# Patient Record
Sex: Male | Born: 1970 | Race: White | Hispanic: No | Marital: Married | Smoking: Never smoker
Health system: Southern US, Community
[De-identification: ages and names within clinical notes are randomized; demographics above are authoritative.]

## PROBLEM LIST (undated history)

## (undated) DIAGNOSIS — K449 Diaphragmatic hernia without obstruction or gangrene: Secondary | ICD-10-CM

## (undated) HISTORY — DX: Diaphragmatic hernia without obstruction or gangrene: K44.9

## (undated) HISTORY — PX: COLONOSCOPY: SHX174

## (undated) HISTORY — PX: UPPER GASTROINTESTINAL ENDOSCOPY: SHX188

---

## 2019-03-22 ENCOUNTER — Other Ambulatory Visit: Payer: Self-pay

## 2019-03-22 DIAGNOSIS — Z20822 Contact with and (suspected) exposure to covid-19: Secondary | ICD-10-CM

## 2019-03-23 LAB — NOVEL CORONAVIRUS, NAA: SARS-CoV-2, NAA: NOT DETECTED

## 2019-03-27 ENCOUNTER — Other Ambulatory Visit: Payer: Self-pay | Admitting: Internal Medicine

## 2019-03-27 DIAGNOSIS — E785 Hyperlipidemia, unspecified: Secondary | ICD-10-CM

## 2019-05-01 ENCOUNTER — Ambulatory Visit
Admission: RE | Admit: 2019-05-01 | Discharge: 2019-05-01 | Disposition: A | Discharge status: Home / Self Care | Payer: No Typology Code available for payment source | Source: Ambulatory Visit | Attending: Internal Medicine | Admitting: Internal Medicine

## 2019-05-01 DIAGNOSIS — E785 Hyperlipidemia, unspecified: Secondary | ICD-10-CM

## 2020-09-30 ENCOUNTER — Encounter: Payer: Self-pay | Admitting: Internal Medicine

## 2020-10-01 ENCOUNTER — Telehealth: Payer: Self-pay | Admitting: *Deleted

## 2020-10-01 NOTE — Telephone Encounter (Signed)
Pyrtle, Carie Caddy, MD  Richardson Chiquito, CMA  I do not see a problem adding the EGD  I will ask the office to do so.  The office will try to pre-certify the EGD portion and they should let me know if there is an issue.  I do not think formal office visit is needed (unless insurance won't pay without).  Thanks   Beverlyn Roux,  Please add EGD to the already scheduled colonoscopy for Dr. Mena Goes. Indication: GERD, epigastric pain  JMP        Dr. Clelia Croft referred over for routine screening colonoscopy.  + heart burn and epigastric pain which is non-cardiac (neg EKG,  go to gym/run a few times a week, negative coronary calcium scan) over the past 2-3 years. had an EGD in 2011 while in the [employer] and it showed a small fundal polyp and hiatal hernia. Can an EGD be done at the time of c-scope . Also grandfather may have had stomach cancer (might have been pancreas) but whatever it was it was stage IV.

## 2020-11-19 ENCOUNTER — Other Ambulatory Visit: Payer: Self-pay

## 2020-11-19 ENCOUNTER — Ambulatory Visit (AMBULATORY_SURGERY_CENTER): Payer: Self-pay | Admitting: *Deleted

## 2020-11-19 VITALS — Ht 70.0 in | Wt 190.0 lb

## 2020-11-19 DIAGNOSIS — Z1211 Encounter for screening for malignant neoplasm of colon: Secondary | ICD-10-CM

## 2020-11-19 DIAGNOSIS — R1013 Epigastric pain: Secondary | ICD-10-CM

## 2020-11-19 DIAGNOSIS — K219 Gastro-esophageal reflux disease without esophagitis: Secondary | ICD-10-CM

## 2020-11-19 MED ORDER — NA SULFATE-K SULFATE-MG SULF 17.5-3.13-1.6 GM/177ML PO SOLN: 1.0000 | ORAL | 0 refills | Status: DC

## 2020-11-19 NOTE — Progress Notes (Signed)

## 2020-12-12 ENCOUNTER — Telehealth: Payer: Self-pay | Admitting: Internal Medicine

## 2020-12-12 DIAGNOSIS — K219 Gastro-esophageal reflux disease without esophagitis: Secondary | ICD-10-CM

## 2020-12-12 DIAGNOSIS — R1013 Epigastric pain: Secondary | ICD-10-CM

## 2020-12-12 DIAGNOSIS — Z1211 Encounter for screening for malignant neoplasm of colon: Secondary | ICD-10-CM

## 2020-12-12 MED ORDER — NA SULFATE-K SULFATE-MG SULF 17.5-3.13-1.6 GM/177ML PO SOLN: 1.0000 | ORAL | 0 refills | Status: DC

## 2020-12-12 NOTE — Telephone Encounter (Signed)
Inbound from pt's wife Amy wanting to know if the prep can be transferred to [facility]. Please give her a call. Thank you.

## 2020-12-12 NOTE — Telephone Encounter (Signed)
Resent patient's prep to [facility]. Left a voicemail for patient's wife to let her know that the prep has been resent.

## 2020-12-16 ENCOUNTER — Encounter: Payer: Self-pay | Admitting: Internal Medicine

## 2020-12-17 ENCOUNTER — Ambulatory Visit (AMBULATORY_SURGERY_CENTER): Payer: PRIVATE HEALTH INSURANCE | Admitting: Internal Medicine

## 2020-12-17 ENCOUNTER — Encounter: Payer: Self-pay | Admitting: Internal Medicine

## 2020-12-17 ENCOUNTER — Other Ambulatory Visit: Payer: Self-pay

## 2020-12-17 VITALS — BP 130/95 | HR 55 | Temp 98.1°F | Resp 15 | Ht 70.0 in | Wt 190.0 lb

## 2020-12-17 DIAGNOSIS — K219 Gastro-esophageal reflux disease without esophagitis: Secondary | ICD-10-CM | POA: Diagnosis not present

## 2020-12-17 DIAGNOSIS — Z1211 Encounter for screening for malignant neoplasm of colon: Secondary | ICD-10-CM | POA: Diagnosis not present

## 2020-12-17 DIAGNOSIS — R1013 Epigastric pain: Secondary | ICD-10-CM

## 2020-12-17 DIAGNOSIS — K449 Diaphragmatic hernia without obstruction or gangrene: Secondary | ICD-10-CM | POA: Diagnosis not present

## 2020-12-17 MED ORDER — SODIUM CHLORIDE 0.9 % IV SOLN: 500.0000 mL | Freq: Once | INTRAVENOUS | Status: AC

## 2020-12-17 NOTE — Op Note (Signed)
[facility] Patient Name: Ian Nichols Procedure Date: 12/17/2020 11:39 AM MRN: [id number] Endoscopist: Beverley Fiedler , MD Age: 50 Referring MD:  Date of Birth: 25-Feb-1971 Gender: Male Account #: [id number] Procedure:                Upper GI endoscopy Indications:              Epigastric abdominal pain, Heartburn Medicines:                Monitored Anesthesia Care Procedure:                Pre-Anesthesia Assessment:                           - Prior to the procedure, a History and Physical                            was performed, and patient medications and                            allergies were reviewed. The patient's tolerance of                            previous anesthesia was also reviewed. The risks                            and benefits of the procedure and the sedation                            options and risks were discussed with the patient.                            All questions were answered, and informed consent                            was obtained. Prior Anticoagulants: The patient has                            taken no previous anticoagulant or antiplatelet                            agents. ASA Grade Assessment: II - A patient with                            mild systemic disease. After reviewing the risks                            and benefits, the patient was deemed in                            satisfactory condition to undergo the procedure.                           After obtaining informed consent, the endoscope was  passed under direct vision. Throughout the                            procedure, the patient's blood pressure, pulse, and                            oxygen saturations were monitored continuously. The                            Endoscope was introduced through the mouth, and                            advanced to the second part of duodenum. The upper                            GI endoscopy was  accomplished without difficulty.                            The patient tolerated the procedure well. Scope In: Scope Out: Findings:                 LA Grade A (one or more mucosal breaks less than 5                            mm, not extending between tops of 2 mucosal folds)                            esophagitis with no bleeding was found 40 cm from                            the incisors. Z-line is regular with no evidence                            for Barrett's esophagus.                           The gastroesophageal flap valve was visualized                            endoscopically and classified as Hill Grade II                            (fold present, opens with respiration). Likely very                            small (1 cm) sliding type hiatal hernia).                           The entire examined stomach was normal.                           The examined duodenum was normal. Complications:            No immediate complications. Estimated Blood Loss:     Estimated blood  loss: none. Impression:               - LA Grade A reflux esophagitis with no bleeding.                           - Gastroesophageal flap valve classified as Hill                            Grade II (fold present, opens with respiration).                            Likely small hiatal hernia.                           - Normal stomach.                           - Normal examined duodenum.                           - No specimens collected. Recommendation:           - Patient has a contact number available for                            emergencies. The signs and symptoms of potential                            delayed complications were discussed with the                            patient. Return to normal activities tomorrow.                            Written discharge instructions were provided to the                            patient.                           - Resume previous diet.                            - Continue present medications. Will discuss PPI                            with patient versus H2 blocker. Beverley Fiedler, MD 12/17/2020 12:09:37 PM This report has been signed electronically.

## 2020-12-17 NOTE — Progress Notes (Signed)
VS by CW  I have reviewed the patient's medical history in detail and updated the computerized patient record.  

## 2020-12-17 NOTE — Progress Notes (Signed)
pt tolerated well. VSS. awake and to recovery. Report given to RN.  

## 2020-12-17 NOTE — Op Note (Signed)
[facility] Patient Name: Ian Nichols Procedure Date: 12/17/2020 11:39 AM MRN: [id number] Endoscopist: Beverley Fiedler , MD Age: 50 Referring MD:  Date of Birth: [birth year] Gender: Male Account #: [id number] Procedure:                Colonoscopy Indications:              Screening for colorectal malignant neoplasm, This                            is the patient's first colonoscopy Medicines:                Monitored Anesthesia Care Procedure:                Pre-Anesthesia Assessment:                           - Prior to the procedure, a History and Physical                            was performed, and patient medications and                            allergies were reviewed. The patient's tolerance of                            previous anesthesia was also reviewed. The risks                            and benefits of the procedure and the sedation                            options and risks were discussed with the patient.                            All questions were answered, and informed consent                            was obtained. Prior Anticoagulants: The patient has                            taken no previous anticoagulant or antiplatelet                            agents. ASA Grade Assessment: II - A patient with                            mild systemic disease. After reviewing the risks                            and benefits, the patient was deemed in                            satisfactory condition to undergo the procedure.  After obtaining informed consent, the colonoscope                            was passed under direct vision. Throughout the                            procedure, the patient's blood pressure, pulse, and                            oxygen saturations were monitored continuously. The                            Olympus CF-[id number]) Colonoscope was                            introduced through the anus and  advanced to the                            cecum, identified by appendiceal orifice and                            ileocecal valve. The colonoscopy was performed                            without difficulty. The patient tolerated the                            procedure well. The quality of the bowel                            preparation was excellent. The ileocecal valve,                            appendiceal orifice, and rectum were photographed. Scope In: 11:49:21 AM Scope Out: 12:03:55 PM Scope Withdrawal Time: 0 hours 10 minutes 33 seconds  Total Procedure Duration: 0 hours 14 minutes 34 seconds  Findings:                 The digital rectal exam was normal.                           Multiple small and large-mouthed diverticula were                            found in the sigmoid colon, descending colon and                            distal transverse colon.                           The exam was otherwise without abnormality on                            direct and retroflexion views. Complications:            No immediate  complications. Estimated Blood Loss:     Estimated blood loss: none. Impression:               - Mild diverticulosis in the sigmoid colon, in the                            descending colon and in the distal transverse colon.                           - The examination was otherwise normal on direct                            and retroflexion views.                           - No specimens collected. Recommendation:           - Patient has a contact number available for                            emergencies. The signs and symptoms of potential                            delayed complications were discussed with the                            patient. Return to normal activities tomorrow.                            Written discharge instructions were provided to the                            patient.                           - Resume previous diet.                            - Continue present medications.                           - Repeat colonoscopy in 10 years for screening                            purposes. Beverley Fiedler, MD 12/17/2020 12:11:30 PM This report has been signed electronically.

## 2020-12-17 NOTE — Patient Instructions (Signed)
Handout on GERD/hiatal hernia given to you today  Handout on Esophagitis given to you today   Handout on diverticulosis given to you today   YOU HAD AN ENDOSCOPIC PROCEDURE TODAY AT THE [facility]:   Refer to the procedure report that was given to you for any specific questions about what was found during the examination.  If the procedure report does not answer your questions, please call your gastroenterologist to clarify.  If you requested that your care partner not be given the details of your procedure findings, then the procedure report has been included in a sealed envelope for you to review at your convenience later.  YOU SHOULD EXPECT: Some feelings of bloating in the abdomen. Passage of more gas than usual.  Walking can help get rid of the air that was put into your GI tract during the procedure and reduce the bloating. If you had a lower endoscopy (such as a colonoscopy or flexible sigmoidoscopy) you may notice spotting of blood in your stool or on the toilet paper. If you underwent a bowel prep for your procedure, you may not have a normal bowel movement for a few days.  Please Note:  You might notice some irritation and congestion in your nose or some drainage.  This is from the oxygen used during your procedure.  There is no need for concern and it should clear up in a day or so.  SYMPTOMS TO REPORT IMMEDIATELY:  Following lower endoscopy (colonoscopy or flexible sigmoidoscopy):  Excessive amounts of blood in the stool  Significant tenderness or worsening of abdominal pains  Swelling of the abdomen that is new, acute  Fever of 100F or higher  Following upper endoscopy (EGD)  Vomiting of blood or coffee ground material  New chest pain or pain under the shoulder blades  Painful or persistently difficult swallowing  New shortness of breath  Fever of 100F or higher  Black, tarry-looking stools  For urgent or emergent issues, a gastroenterologist can be  reached at any hour by calling [phone]. Do not use MyChart messaging for urgent concerns.    DIET:  We do recommend a small meal at first, but then you may proceed to your regular diet.  Drink plenty of fluids but you should avoid alcoholic beverages for 24 hours.  ACTIVITY:  You should plan to take it easy for the rest of today and you should NOT DRIVE or use heavy machinery until tomorrow (because of the sedation medicines used during the test).    FOLLOW UP: Our staff will call the number listed on your records 48-72 hours following your procedure to check on you and address any questions or concerns that you may have regarding the information given to you following your procedure. If we do not reach you, we will leave a message.  We will attempt to reach you two times.  During this call, we will ask if you have developed any symptoms of COVID 19. If you develop any symptoms (ie: fever, flu-like symptoms, shortness of breath, cough etc.) before then, please call [phone].  If you test positive for Covid 19 in the 2 weeks post procedure, please call and report this information to [place].    If any biopsies were taken you will be contacted by phone or by letter within the next 1-3 weeks.  Please call us at [phone] if you have not heard about the biopsies in 3 weeks.    SIGNATURES/CONFIDENTIALITY: You and/or your care  partner have signed paperwork which will be entered into your electronic medical record.  These signatures attest to the fact that that the information above on your After Visit Summary has been reviewed and is understood.  Full responsibility of the confidentiality of this discharge information lies with you and/or your care-partner.

## 2020-12-19 ENCOUNTER — Telehealth: Payer: Self-pay | Admitting: *Deleted

## 2020-12-19 NOTE — Telephone Encounter (Signed)
  Follow up Call-  Call back number 12/17/2020  Post procedure Call Back phone  # [phone]  Permission to leave phone message Yes     Patient questions:  Do you have a fever, pain , or abdominal swelling? No. Pain Score  0 *  Have you tolerated food without any problems? Yes.    Have you been able to return to your normal activities? Yes.    Do you have any questions about your discharge instructions: Diet   No. Medications  No. Follow up visit  No.  Do you have questions or concerns about your Care? No.  Actions: * If pain score is 4 or above: No action needed, pain <4.  Have you developed a fever since your procedure? no  2.   Have you had an respiratory symptoms (SOB or cough) since your procedure? no  3.   Have you tested positive for COVID 19 since your procedure no  4.   Have you had any family members/close contacts diagnosed with the COVID 19 since your procedure?  no   If yes to any of these questions please route to Laverna Peace, RN and Karlton Lemon, RN

## 2021-04-22 IMAGING — CT CT HEART SCORING
3 series · 14 of 20 positions shown, 16 images · non-contrast
Comparison: None.

CLINICAL DATA: Hyperlipidemia

EXAM:
CT HEART FOR CALCIUM SCORING
TECHNIQUE: CT heart was performed using prospective ECG gating.
A non-contrast exam for calcium scoring was performed.
Note that this exam targets the heart and the chest was not imaged
in its entirety.

[Series 2: calcium scoring 2.00 qr36 bestdiast 69% · axial · 0.45mm/px · z∈[+1500,+1588]mm · 4 of 74 slices shown]
[im 15/74  vessel]
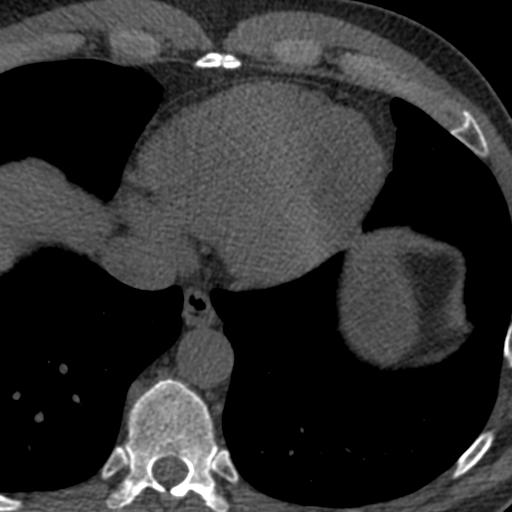
[im 30/74  vessel]
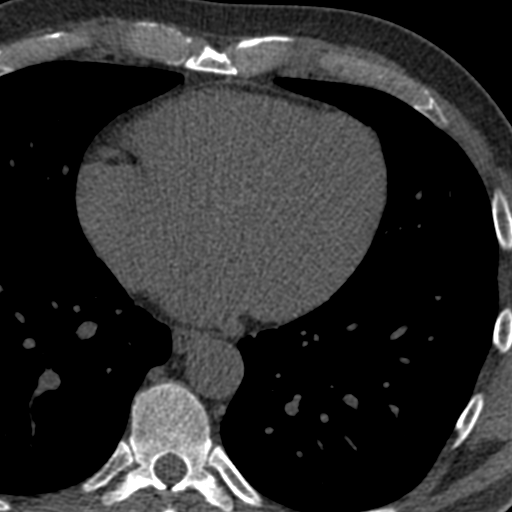
[im 44/74  vessel]
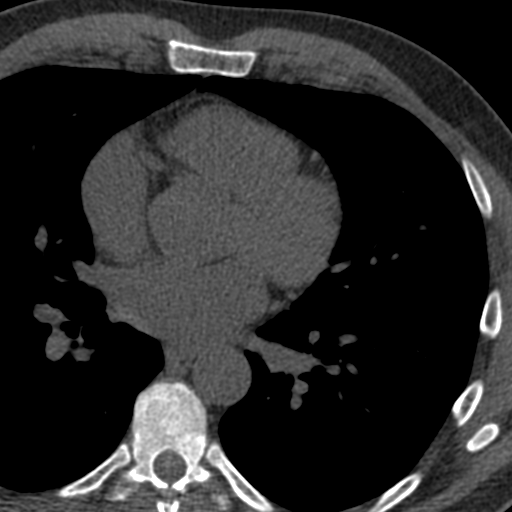
[im 59/74  vessel]
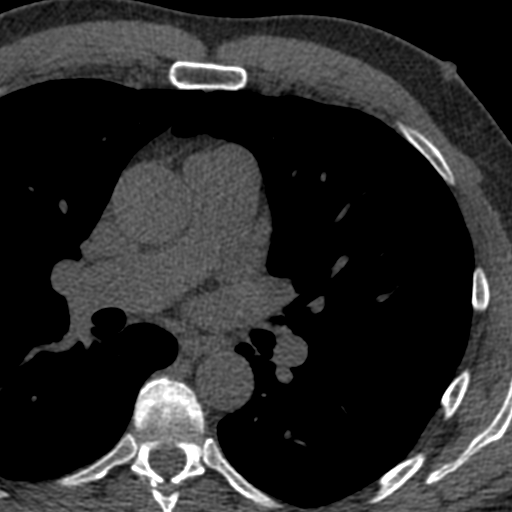

[Series 3: calcium scoring 2.00 br40 bestdiast 69% fov · axial · 0.55mm/px · z∈[+1486,+1590]mm · 5 of 80 slices shown, 7 images]
[im 14/80  vessel]
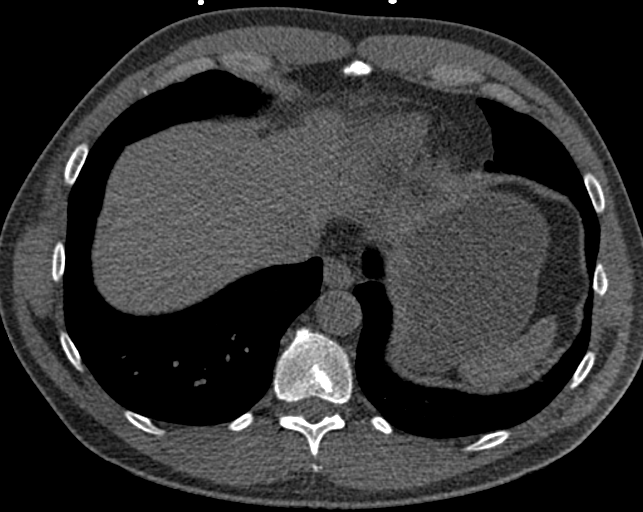
[im 14/80  lung]
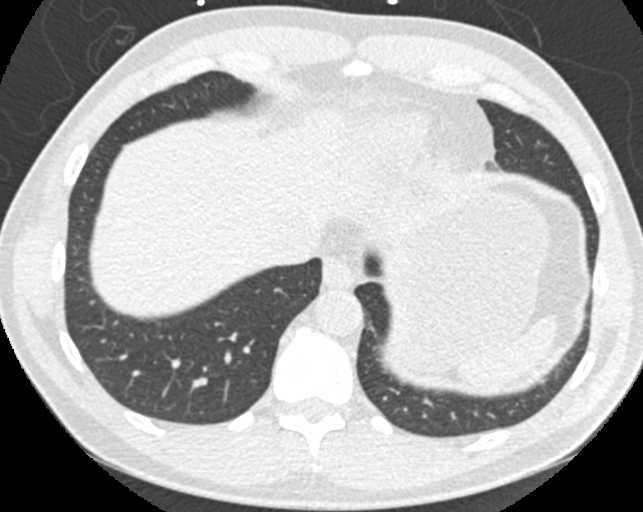
[im 27/80  vessel]
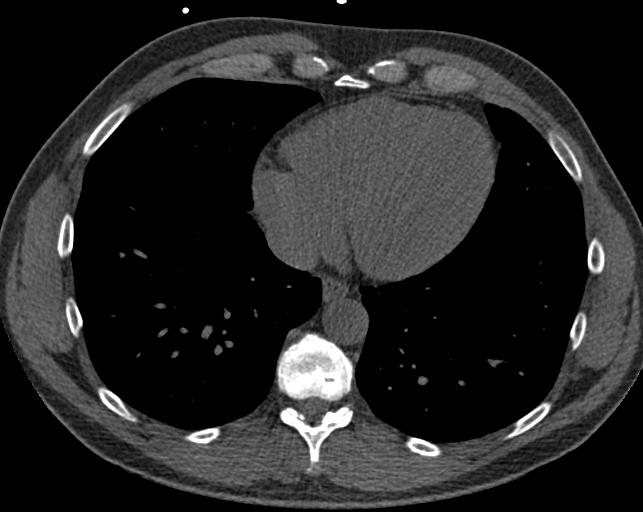
[im 40/80  vessel]
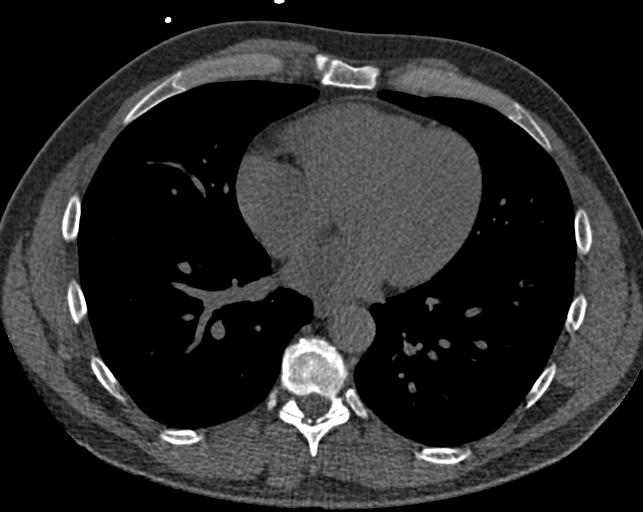
[im 53/80  vessel]
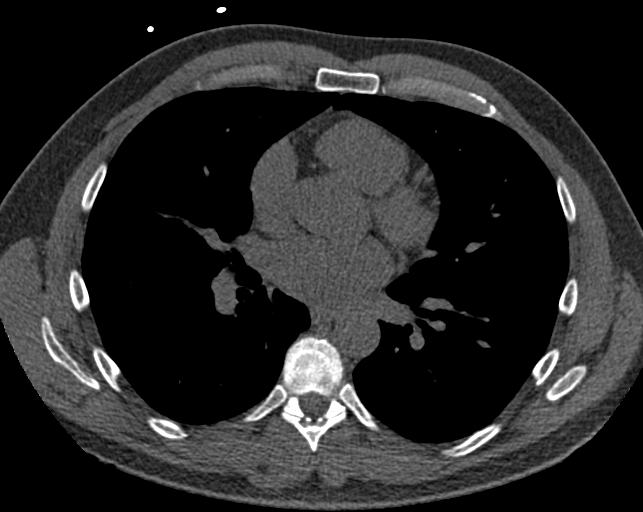
[im 66/80  vessel]
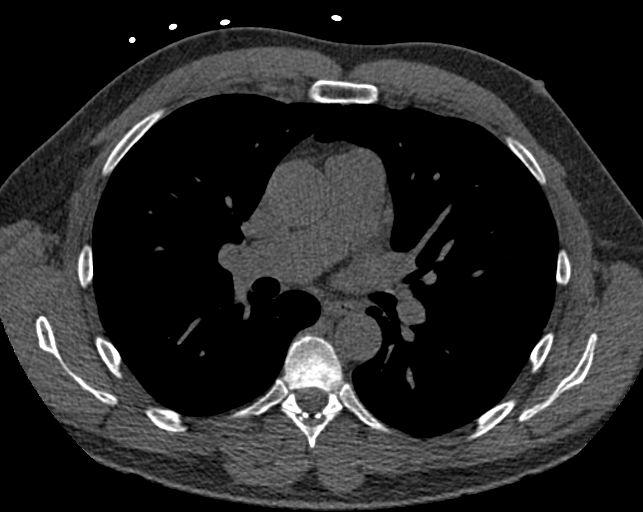
[im 66/80  lung]
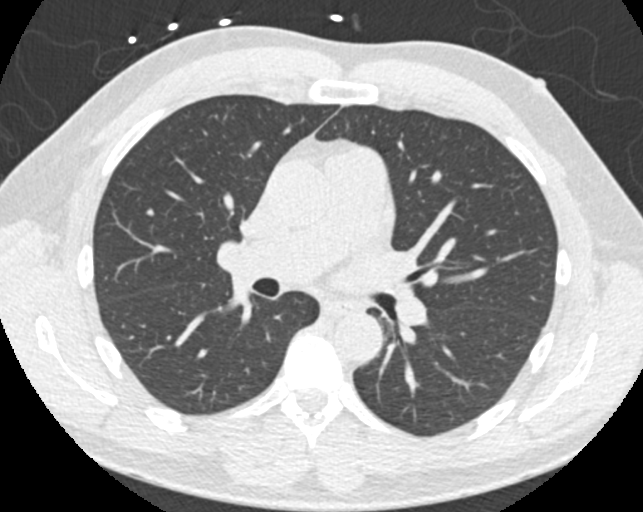

[Series 9: calcium scoring 2.00 br60 bestdiast 69% fov · axial · 0.52mm/px · z∈[+1486,+1590]mm · 5 of 80 slices shown]
[im 14/80  vessel]
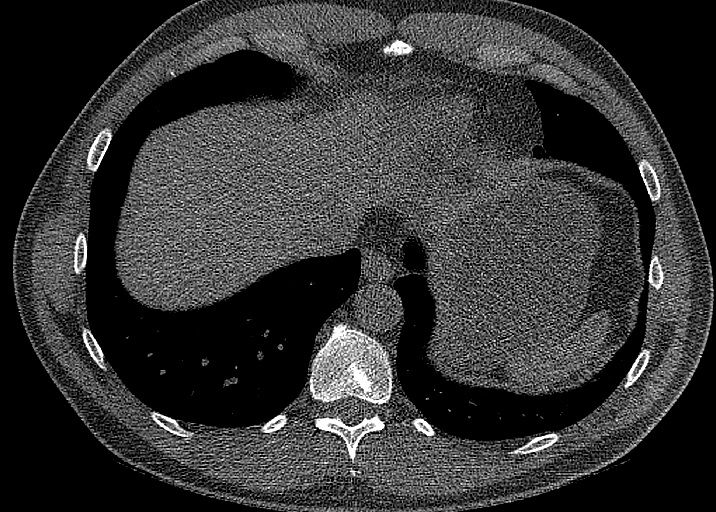
[im 27/80  vessel]
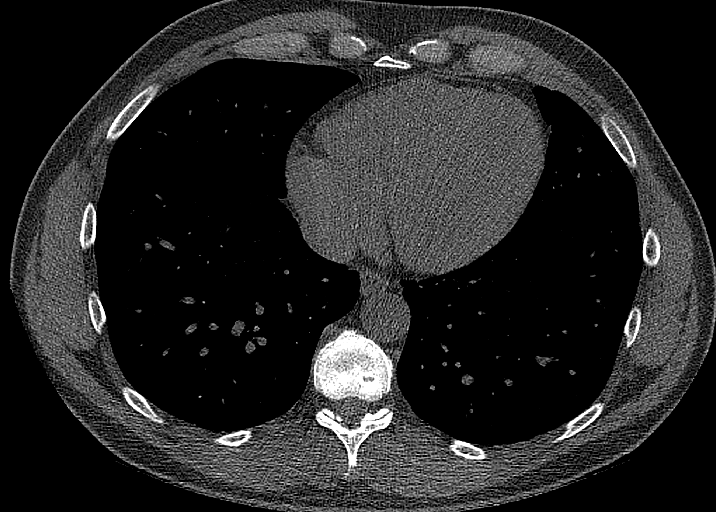
[im 40/80  vessel]
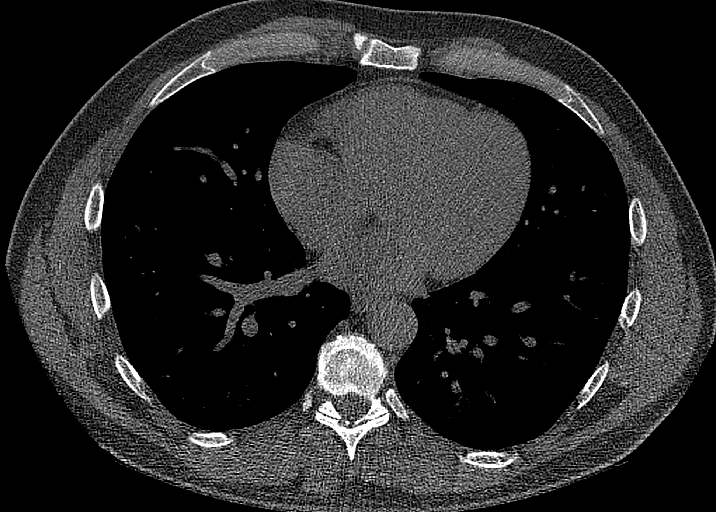
[im 53/80  vessel]
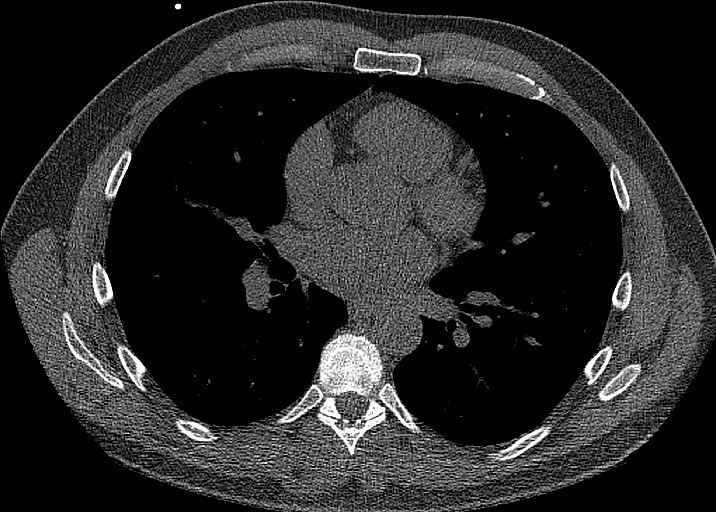
[im 66/80  vessel]
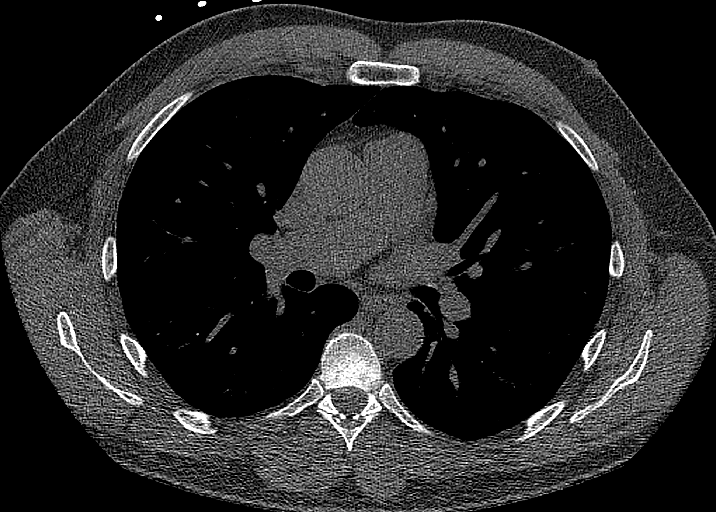

[14 of 20 positions shown; findings below may reference images not displayed]

FINDINGS: Technical quality: Good.

CORONARY CALCIUM

Total Agatston Score: 0

[HOSPITAL] percentile:  0

OTHER FINDINGS:

Cardiovascular: Heart is normal size.  Aorta is normal caliber.

Mediastinum/Nodes: No adenopathy in the lower mediastinum or hila.

Lungs/Pleura: Visualized lungs clear.  No effusions.

Upper Abdomen: Imaging into the upper abdomen shows no acute
findings.

Musculoskeletal: Chest wall soft tissues are unremarkable. No acute
bony abnormality.
IMPRESSION: No visible coronary artery calcifications. Total coronary calcium
score of 0.

No acute or significant extracardiac abnormality.

## 2022-02-17 ENCOUNTER — Ambulatory Visit: Admit: 2022-02-17 | Payer: PRIVATE HEALTH INSURANCE | Admitting: Urology

## 2022-02-17 SURGERY — TURBT (TRANSURETHRAL RESECTION OF BLADDER TUMOR)
Anesthesia: General

## 2023-10-29 ENCOUNTER — Encounter: Payer: Self-pay | Admitting: Urology
